# Patient Record
Sex: Male | Born: 2005 | Race: Black or African American | Hispanic: No | Marital: Single | Smoking: Current every day smoker
Health system: Southern US, Community
[De-identification: ages and names within clinical notes are randomized; demographics above are authoritative.]

## PROBLEM LIST (undated history)

## (undated) DIAGNOSIS — T7402XA Child neglect or abandonment, confirmed, initial encounter: Secondary | ICD-10-CM

## (undated) DIAGNOSIS — Z553 Underachievement in school: Secondary | ICD-10-CM

## (undated) DIAGNOSIS — R4689 Other symptoms and signs involving appearance and behavior: Secondary | ICD-10-CM

## (undated) DIAGNOSIS — F909 Attention-deficit hyperactivity disorder, unspecified type: Secondary | ICD-10-CM

## (undated) HISTORY — DX: Child neglect or abandonment, confirmed, initial encounter: T74.02XA

## (undated) HISTORY — DX: Other symptoms and signs involving appearance and behavior: R46.89

## (undated) HISTORY — DX: Underachievement in school: Z55.3

## (undated) HISTORY — DX: Attention-deficit hyperactivity disorder, unspecified type: F90.9

---
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
## 2023-08-13 ENCOUNTER — Ambulatory Visit (HOSPITAL_COMMUNITY)
Admission: EM | Admit: 2023-08-13 | Discharge: 2023-08-13 | Disposition: A | Discharge status: Home / Self Care | Payer: MEDICAID | Attending: Emergency Medicine | Admitting: Emergency Medicine

## 2023-08-13 ENCOUNTER — Encounter (HOSPITAL_COMMUNITY): Payer: Self-pay | Admitting: Emergency Medicine

## 2023-08-13 DIAGNOSIS — Z113 Encounter for screening for infections with a predominantly sexual mode of transmission: Secondary | ICD-10-CM | POA: Diagnosis not present

## 2023-08-13 DIAGNOSIS — R369 Urethral discharge, unspecified: Secondary | ICD-10-CM

## 2023-08-13 DIAGNOSIS — Z202 Contact with and (suspected) exposure to infections with a predominantly sexual mode of transmission: Secondary | ICD-10-CM

## 2023-08-13 MED ORDER — CEFTRIAXONE SODIUM 500 MG IJ SOLR
INTRAMUSCULAR | Status: AC
  Filled 2023-08-13: qty 500

## 2023-08-13 MED ORDER — CEFTRIAXONE SODIUM 500 MG IJ SOLR
500.0000 mg | INTRAMUSCULAR | Status: DC
  Administered 2023-08-13: 500 mg via INTRAMUSCULAR

## 2023-08-13 MED ORDER — LIDOCAINE HCL (PF) 1 % IJ SOLN
INTRAMUSCULAR | Status: AC
  Filled 2023-08-13: qty 2

## 2023-08-13 MED ORDER — DOXYCYCLINE HYCLATE 100 MG PO CAPS: 100.0000 mg | ORAL_CAPSULE | Freq: Two times a day (BID) | ORAL | 0 refills | Status: DC
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
## 2023-08-13 NOTE — Discharge Instructions (Addendum)
 You were given an injection of Rocephin  today in clinic to cover for gonorrhea. Start taking doxycycline  twice daily for 10 days for chlamydia coverage. Your results will come back over the next few days and someone will call if results are positive and require additional treatment.  Return here as needed.

## 2023-08-13 NOTE — ED Triage Notes (Signed)
 Pt had penile discharge today that is yellowish. Reports had exposure to gonorrhea and chlamydia.

## 2023-08-14 ENCOUNTER — Telehealth (HOSPITAL_COMMUNITY): Payer: Self-pay

## 2023-08-14 LAB — CYTOLOGY, (ORAL, ANAL, URETHRAL) ANCILLARY ONLY
Chlamydia: POSITIVE — AB
Comment: NEGATIVE
Comment: NEGATIVE
Comment: NORMAL
Neisseria Gonorrhea: POSITIVE — AB
Trichomonas: POSITIVE — AB

## 2023-08-14 MED ORDER — METRONIDAZOLE 500 MG PO TABS: 2000.0000 mg | ORAL_TABLET | Freq: Once | ORAL | 0 refills | Status: AC

## 2023-08-14 NOTE — Telephone Encounter (Signed)
 Per protocol, pt requires tx with metronidazole. Attempted to reach patient x1. LVM.  Rx sent to pharmacy on file.

## 2023-08-22 ENCOUNTER — Telehealth (HOSPITAL_COMMUNITY): Payer: Self-pay

## 2023-08-22 MED ORDER — AZITHROMYCIN 250 MG PO TABS: ORAL_TABLET | ORAL | 0 refills | Status: AC

## 2023-08-22 NOTE — Telephone Encounter (Signed)
 I have sent in azithromycin  to take as an alternative to the doxycycline . Stop doxycycline . Has he taken the metronidazole  for the trichomonas? It should have been a 1 time dose of 4 pills once. The callback nurse did not get to talk to him directly.

## 2023-08-22 NOTE — Telephone Encounter (Signed)
 Patient informed and voiced understanding. Patient did taking the metronidazole  as prescribed.

## 2023-08-22 NOTE — Telephone Encounter (Signed)
 Patient called stating that since started the Doxycycline , he has been having nausea, vomiting, hives, and a rash on his leg and thinks that he may be allergic to this medication. Patient has been taking it since 08/14/2023 and wanted to know if there was anything else he can take.
